# Patient Record
Sex: Male | Born: 1995 | Marital: Single | Smoking: Never smoker
Health system: Southern US, Community
[De-identification: ages and names within clinical notes are randomized; demographics above are authoritative.]

## PROBLEM LIST (undated history)

## (undated) HISTORY — PX: ORTHOPEDIC SURGERY: SHX850

---
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
## 2016-04-17 ENCOUNTER — Ambulatory Visit
Admission: EM | Admit: 2016-04-17 | Discharge: 2016-04-17 | Disposition: A | Discharge status: Home / Self Care | Payer: BLUE CROSS/BLUE SHIELD | Attending: Emergency Medicine | Admitting: Emergency Medicine

## 2016-04-17 DIAGNOSIS — J111 Influenza due to unidentified influenza virus with other respiratory manifestations: Secondary | ICD-10-CM

## 2016-04-17 LAB — RAPID INFLUENZA A&B ANTIGENS: Influenza B (ARMC): POSITIVE — AB

## 2016-04-17 LAB — RAPID INFLUENZA A&B ANTIGENS (ARMC ONLY): INFLUENZA A (ARMC): NEGATIVE

## 2016-04-17 LAB — RAPID STREP SCREEN (MED CTR MEBANE ONLY): Streptococcus, Group A Screen (Direct): NEGATIVE

## 2016-04-17 MED ORDER — OSELTAMIVIR PHOSPHATE 75 MG PO CAPS: 75.0000 mg | ORAL_CAPSULE | Freq: Two times a day (BID) | ORAL | 0 refills | Status: AC

## 2016-04-17 MED ORDER — BENZONATATE 200 MG PO CAPS: 200.0000 mg | ORAL_CAPSULE | Freq: Three times a day (TID) | ORAL | 0 refills | Status: AC

## 2016-04-17 NOTE — ED Provider Notes (Signed)
CSN: [id number]     Arrival date & time 04/17/16  1107 History   First MD Initiated Contact with Patient 04/17/16 1408     Chief Complaint  Patient presents with  . Influenza   (Consider location/radiation/quality/duration/timing/severity/associated sxs/prior Treatment) HPI  This a 21 year old male who is accompanied by his mother with symptoms of headache bodyaches fever sore throat and diarrhea along with nausea. Sometimes started yesterday afternoon. Is not had a flu shot this year. He has had  coughing which has been worsening. 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 No pertinent past medical history. Past Surgical History:  Procedure Laterality Date  . ORTHOPEDIC SURGERY     History reviewed. No pertinent family history. Social History  Substance Use Topics  . Smoking status: Never Smoker  . Smokeless tobacco: Never Used  . Alcohol use Yes     Comment: social    Review of Systems  Constitutional: Positive for activity change, chills, fatigue and fever.  HENT: Positive for congestion, postnasal drip and rhinorrhea.   Respiratory: Positive for cough. Negative for shortness of breath.   Gastrointestinal: Positive for nausea and vomiting.  All other systems reviewed and are negative.   Allergies  Patient has no known allergies.  Home Medications   Prior to Admission medications   Medication Sig Start Date End Date Taking? Authorizing Provider  benzonatate (TESSALON) 200 MG capsule Take 1 capsule (200 mg total) by mouth every 8 (eight) hours. As Necessary for coughing 04/17/16   Lutricia FeilWilliam P Roemer, PA-C  oseltamivir (TAMIFLU) 75 MG capsule Take 1 capsule (75 mg total) by mouth every 12 (twelve) hours. 04/17/16   Lutricia FeilWilliam P Roemer, PA-C   Meds Ordered and Administered this Visit  Medications - No data to display  BP (!) 120/92 (BP Location: Left Arm)   Pulse (!) 122   Temp 99.6 F (37.6 C) (Oral)   Resp 16   Ht 6\' 2"  (1.88 m)   Wt 184 lb (83.5 kg)    SpO2 100%   BMI 23.62 kg/m  No data found.   Physical Exam  Constitutional: He is oriented to person, place, and time. He appears well-developed and well-nourished. No distress.  HENT:  Head: Normocephalic and atraumatic.  Right Ear: External ear normal.  Left Ear: External ear normal.  Nose: Nose normal.  Mouth/Throat: Oropharynx is clear and moist. No oropharyngeal exudate.  Eyes: EOM are normal. Pupils are equal, round, and reactive to light. Right eye exhibits no discharge. Left eye exhibits no discharge.  Neck: Normal range of motion. Neck supple.  Pulmonary/Chest: Effort normal and breath sounds normal. No respiratory distress. He has no wheezes. He has no rales.  Musculoskeletal: Normal range of motion.  Lymphadenopathy:    He has no cervical adenopathy.  Neurological: He is alert and oriented to person, place, and time.  Skin: Skin is warm and dry. He is not diaphoretic.  Psychiatric: He has a normal mood and affect. His behavior is normal. Judgment and thought content normal.  Nursing note and vitals reviewed.   Urgent Care Course   Clinical Course     Procedures (including critical care time)  Labs Review Labs Reviewed  RAPID INFLUENZA A&B ANTIGENS ([facility] ONLY) - Abnormal; Notable for the following:       Result Value   Influenza B ([facility]) POSITIVE (*)    All other components within normal limits  RAPID STREP SCREEN (NOT AT [facility])  CULTURE, GROUP A STREP (  [facility])    Imaging Review No results found.   Visual Acuity Review  Right Eye Distance:   Left Eye Distance:   Bilateral Distance:    Right Eye Near:   Left Eye Near:    Bilateral Near:         MDM   1. Influenza    Discharge Medication List as of 04/17/2016  2:18 PM    START taking these medications   Details  benzonatate (TESSALON) 200 MG capsule Take 1 capsule (200 mg total) by mouth every 8 (eight) hours. As Necessary for coughing, Starting Sun 04/17/2016, Normal    oseltamivir (TAMIFLU)  75 MG capsule Take 1 capsule (75 mg total) by mouth every 12 (twelve) hours., Starting Sun 04/17/2016, Normal      Plan: 1. Test/x-ray results and diagnosis reviewed with patient 2. rx as per orders; risks, benefits, potential side effects reviewed with patient 3. Recommend supportive treatment with Rest and fluids. Ibuprofen or Tylenol for body aches and pains. Mother states they have symptomatic medications at home. She would like to have some cough medicine and I have provided him with Tessalon pearls. 10 years they can use over-the-counter cough preparations. I will keep him out of school through Wednesday. 4. F/u prn if symptoms worsen or don't improve     Lutricia Feil, PA-C 04/17/16 1426

## 2016-04-17 NOTE — ED Triage Notes (Signed)
Pt with "flu-like" symptoms including headache, body aches, subjective fever, sore throat, and diarrhea

## 2016-04-20 LAB — CULTURE, GROUP A STREP (THRC)

## 2016-04-21 ENCOUNTER — Telehealth (HOSPITAL_COMMUNITY): Payer: Self-pay | Admitting: Internal Medicine

## 2016-04-21 MED ORDER — PENICILLIN V POTASSIUM 500 MG PO TABS: 500.0000 mg | ORAL_TABLET | Freq: Two times a day (BID) | ORAL | 0 refills | Status: AC

## 2016-04-21 NOTE — Telephone Encounter (Signed)
Clinical staff, please let patient know that throat culture was positive for non group A strep.  Rx penicillin sent to pharmacy of [place] [facility] [place].  Recheck or followup with PCP for further evaluation if symptoms not improving.  LM

## 2016-04-29 ENCOUNTER — Telehealth: Payer: Self-pay | Admitting: Emergency Medicine

## 2016-04-29 NOTE — Telephone Encounter (Signed)
Patient called [facility] after receiving the letter informing him to call our office.  Patient was notified that his throat culture came back positive for strep and the Dr. Dayton ScrapeMurray had sent a prescription for Penicillin to his pharam[facility] [place] for [place] to [place] and start.  Patient verbalized understanding.

## 2024-04-09 ENCOUNTER — Ambulatory Visit: Payer: Self-pay
